# Patient Record
Sex: Female | Born: 1975 | Race: Black or African American | Hispanic: No | Marital: Married | Smoking: Never smoker
Health system: Southern US, Community
[De-identification: ages and names within clinical notes are randomized; demographics above are authoritative.]

## PROBLEM LIST (undated history)

## (undated) HISTORY — PX: WISDOM TOOTH EXTRACTION: SHX21

---

## 2009-07-02 ENCOUNTER — Ambulatory Visit: Payer: Self-pay | Admitting: Internal Medicine

## 2009-07-23 ENCOUNTER — Ambulatory Visit: Payer: Self-pay | Admitting: Internal Medicine

## 2011-06-24 ENCOUNTER — Ambulatory Visit: Payer: Self-pay | Admitting: Internal Medicine

## 2015-03-07 ENCOUNTER — Ambulatory Visit (INDEPENDENT_AMBULATORY_CARE_PROVIDER_SITE_OTHER): Payer: 59 | Admitting: Obstetrics and Gynecology

## 2015-03-07 ENCOUNTER — Encounter: Payer: Self-pay | Admitting: Obstetrics and Gynecology

## 2015-03-07 VITALS — BP 162/99 | HR 73 | Ht 68.0 in | Wt 230.0 lb

## 2015-03-07 DIAGNOSIS — E669 Obesity, unspecified: Secondary | ICD-10-CM

## 2015-03-07 DIAGNOSIS — Z1151 Encounter for screening for human papillomavirus (HPV): Secondary | ICD-10-CM

## 2015-03-07 DIAGNOSIS — Z01419 Encounter for gynecological examination (general) (routine) without abnormal findings: Secondary | ICD-10-CM

## 2015-03-07 DIAGNOSIS — Z124 Encounter for screening for malignant neoplasm of cervix: Secondary | ICD-10-CM

## 2015-03-07 NOTE — Progress Notes (Signed)
Pt BP = 162/99 taken in lt arm, repeated in rt arm BP = 142/100.  Pt is c/o headache this morning.

## 2015-03-07 NOTE — Progress Notes (Signed)
  Subjective:     Rhonda Sloan is a 39 y.o. female G1P0010 with LMP 02/22/2015 and BMI 35 who is here for a comprehensive physical exam. The patient reports experiencing right breast tenderness last month around the time of her cycle. She is otherwise without complaints. She is sexually active without contraception and would welcome a pregnancy if it were to occur. She has monthly menses lasting 5 days with moderate flow.  Social History   Social History  . Marital Status: Married    Spouse Name: N/A  . Number of Children: N/A  . Years of Education: N/A   Occupational History  . Not on file.   Social History Main Topics  . Smoking status: Never Smoker   . Smokeless tobacco: Not on file  . Alcohol Use: Yes     Comment: socially  . Drug Use: No  . Sexual Activity: Yes    Birth Control/ Protection: None   Other Topics Concern  . Not on file   Social History Narrative  . No narrative on file   Health Maintenance  Topic Date Due  . HIV Screening  10/26/1988  . PAP SMEAR  10/27/1991  . TETANUS/TDAP  10/26/1992  . INFLUENZA VACCINE  02/18/2015   History reviewed. No pertinent past medical history. Past Surgical History  Procedure Laterality Date  . Wisdom tooth extraction     Family History  Problem Relation Age of Onset  . Hypertension Mother   . Diabetes Mother   . Hyperlipidemia Mother   . Hypertension Father        Review of Systems Pertinent items are noted in HPI.   Objective:  Blood pressure 162/99, pulse 73, height  (1.727 m), weight 230 lb (104.327 kg), last menstrual period 02/22/2015.     GENERAL: Well-developed, well-nourished female in no acute distress.  HEENT: Normocephalic, atraumatic. Sclerae anicteric.  NECK: Supple. Normal thyroid.  LUNGS: Clear to auscultation bilaterally.  HEART: Regular rate and rhythm. BREASTS: Symmetric in size. No palpable masses or lymphadenopathy, skin changes, or nipple drainage. ABDOMEN: Soft, nontender,  nondistended. No organomegaly. PELVIC: Normal external female genitalia. Vagina is pink and rugated.  Normal discharge. Normal appearing cervix. Uterus is normal in size. No adnexal mass or tenderness. EXTREMITIES: No cyanosis, clubbing, or edema, 2+ distal pulses.    Assessment:    Healthy female exam.      Plan:    - pap smear collected today - Referral for screening mammogram provided - Patient with high blood pressure today. Patient reports normal blood pressure prior to today's visit. She is currently under the care of at a bariatric clinic and was started on Phentermine for weight loss a week ago. I advised the patient to contact the clinic ASAP and inform of elevated BP. She may have to discontinue the medication. - Patient will be contacted with any abnormal results  See After Visit Summary for Counseling Recommendations

## 2015-03-08 LAB — CYTOLOGY - PAP

## 2015-03-12 ENCOUNTER — Ambulatory Visit
Admission: RE | Admit: 2015-03-12 | Discharge: 2015-03-12 | Disposition: A | Discharge status: Home / Self Care | Payer: 59 | Source: Ambulatory Visit | Attending: Obstetrics and Gynecology | Admitting: Obstetrics and Gynecology

## 2015-03-12 DIAGNOSIS — Z1231 Encounter for screening mammogram for malignant neoplasm of breast: Secondary | ICD-10-CM | POA: Diagnosis not present

## 2015-03-12 DIAGNOSIS — Z01419 Encounter for gynecological examination (general) (routine) without abnormal findings: Secondary | ICD-10-CM | POA: Insufficient documentation

## 2015-03-15 ENCOUNTER — Other Ambulatory Visit: Payer: Self-pay | Admitting: Obstetrics and Gynecology

## 2015-03-15 DIAGNOSIS — N6489 Other specified disorders of breast: Secondary | ICD-10-CM

## 2015-03-15 DIAGNOSIS — R928 Other abnormal and inconclusive findings on diagnostic imaging of breast: Secondary | ICD-10-CM

## 2015-03-21 ENCOUNTER — Ambulatory Visit
Admission: RE | Admit: 2015-03-21 | Discharge: 2015-03-21 | Disposition: A | Discharge status: Home / Self Care | Payer: 59 | Source: Ambulatory Visit | Attending: Obstetrics and Gynecology | Admitting: Obstetrics and Gynecology

## 2015-03-21 DIAGNOSIS — N6489 Other specified disorders of breast: Secondary | ICD-10-CM | POA: Insufficient documentation

## 2015-03-21 DIAGNOSIS — R928 Other abnormal and inconclusive findings on diagnostic imaging of breast: Secondary | ICD-10-CM | POA: Diagnosis present

## 2015-04-23 ENCOUNTER — Encounter: Payer: Self-pay | Admitting: Obstetrics and Gynecology

## 2015-05-22 ENCOUNTER — Encounter: Payer: Self-pay | Admitting: *Deleted

## 2015-10-16 ENCOUNTER — Telehealth: Payer: Self-pay

## 2015-10-16 ENCOUNTER — Telehealth: Payer: Self-pay | Admitting: *Deleted

## 2015-10-16 DIAGNOSIS — R922 Inconclusive mammogram: Secondary | ICD-10-CM

## 2015-10-16 NOTE — Telephone Encounter (Signed)
Pt had diagnostic mammogram and US of the left breast due to inconclusive mammogram related to dense breast tissue.  Recommended f/u in 6 months.  [facility] called in regards to follow-up.  Order placed.

## 2015-10-16 NOTE — Telephone Encounter (Signed)
Called to inform patient that we placed an order for her to have a diagnostic mammogram and a breast ultrasound and she should be able to call and schedule this appt according to what days and times best suit her. Instructed her to call us back at the office if she had an issues or needed additional help. Patient understands

## 2015-11-01 ENCOUNTER — Ambulatory Visit
Admission: RE | Admit: 2015-11-01 | Discharge: 2015-11-01 | Disposition: A | Discharge status: Home / Self Care | Payer: 59 | Source: Ambulatory Visit | Attending: Obstetrics and Gynecology | Admitting: Obstetrics and Gynecology

## 2015-11-01 ENCOUNTER — Other Ambulatory Visit: Payer: Self-pay | Admitting: Obstetrics and Gynecology

## 2015-11-01 DIAGNOSIS — R922 Inconclusive mammogram: Secondary | ICD-10-CM

## 2015-11-01 DIAGNOSIS — N6489 Other specified disorders of breast: Secondary | ICD-10-CM | POA: Diagnosis present

## 2016-04-03 IMAGING — MG MM DIAG BREAST TOMO UNI LEFT
6 of 9 series · 6 of 21 positions shown · non-contrast
Comparison: 07/02/2009

CLINICAL DATA: 39-year-old female, callback from screening
mammogram for possible left breast asymmetry

EXAM:
DIGITAL DIAGNOSTIC LEFT MAMMOGRAM WITH 3D TOMOSYNTHESIS WITH CAD
ULTRASOUND LEFT BREAST

[L MLO (1 of 2)]
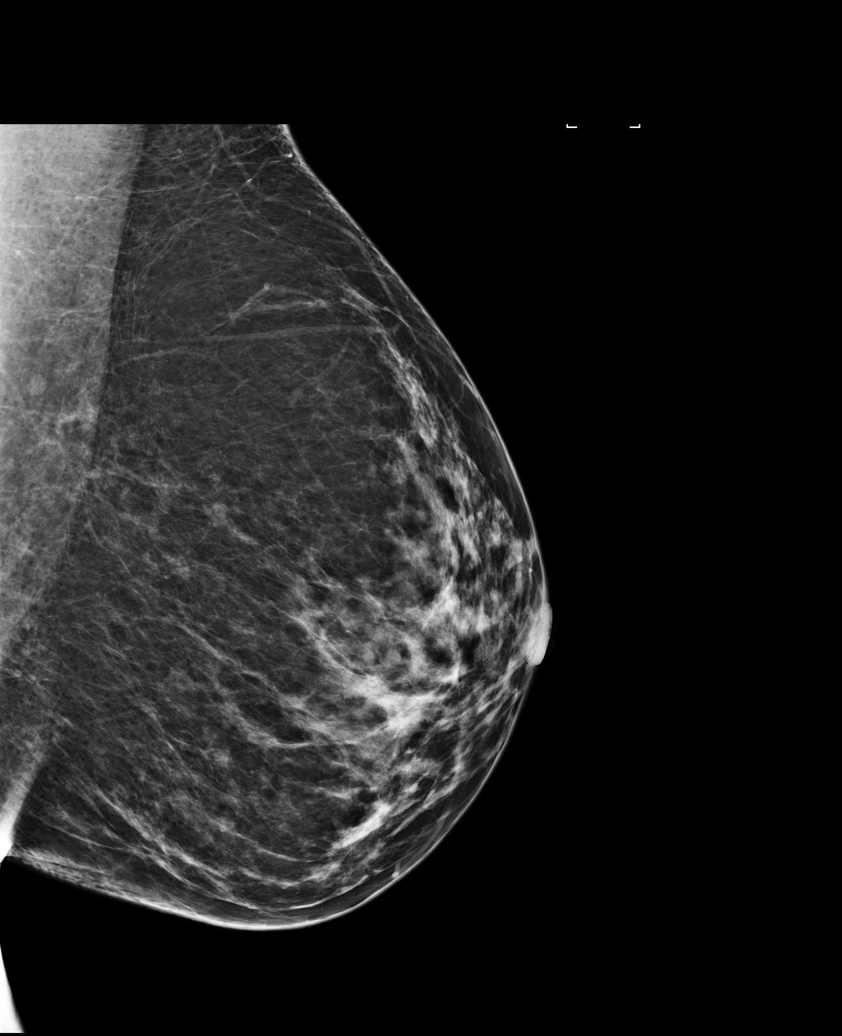

[L CC synth-2D]
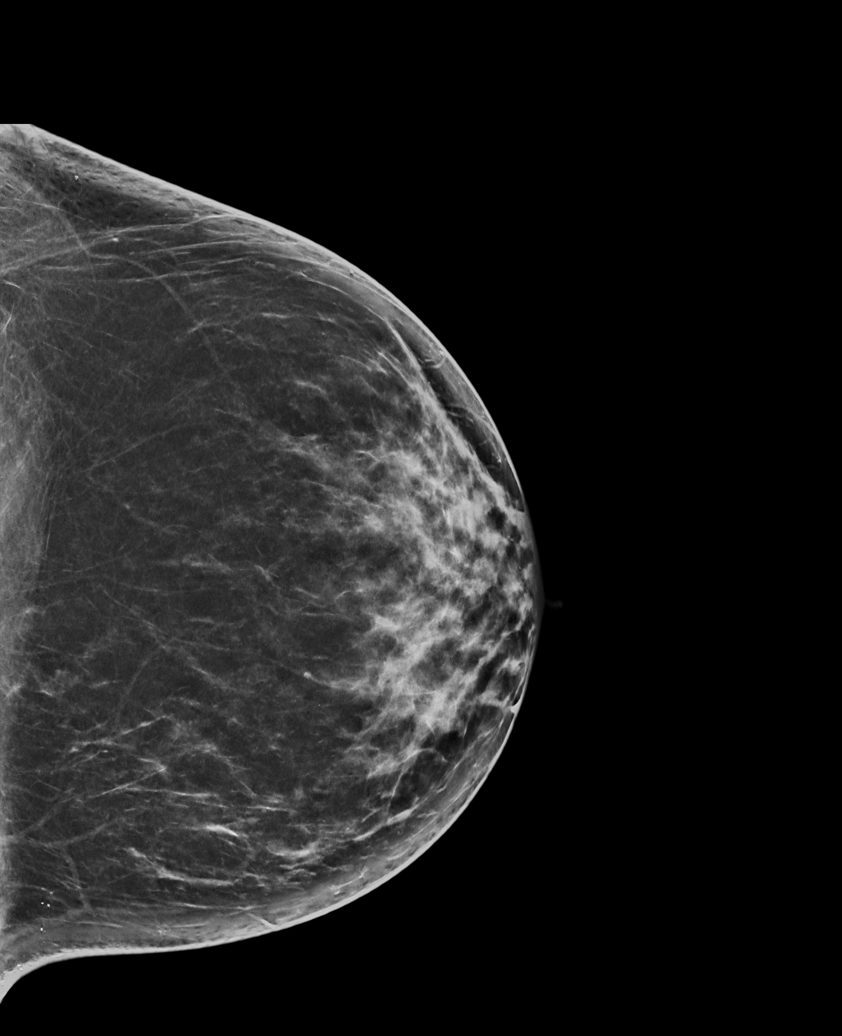

[L MLO synth-2D (1 of 2)]
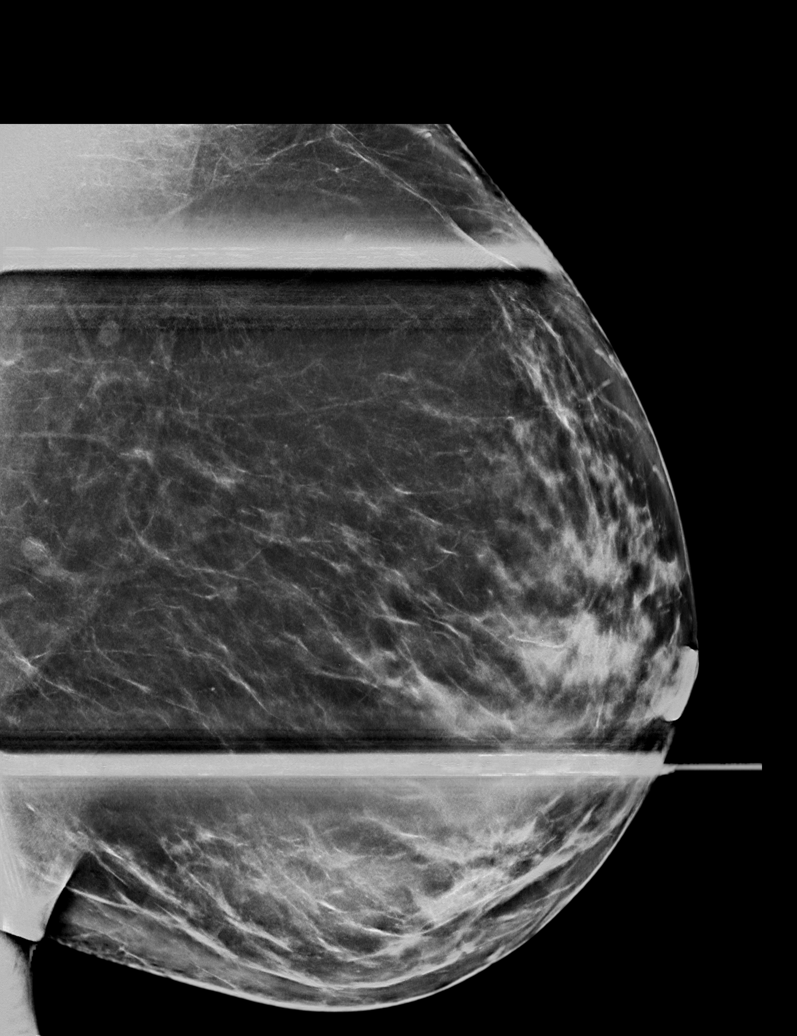

[L MLO (2 of 2)]
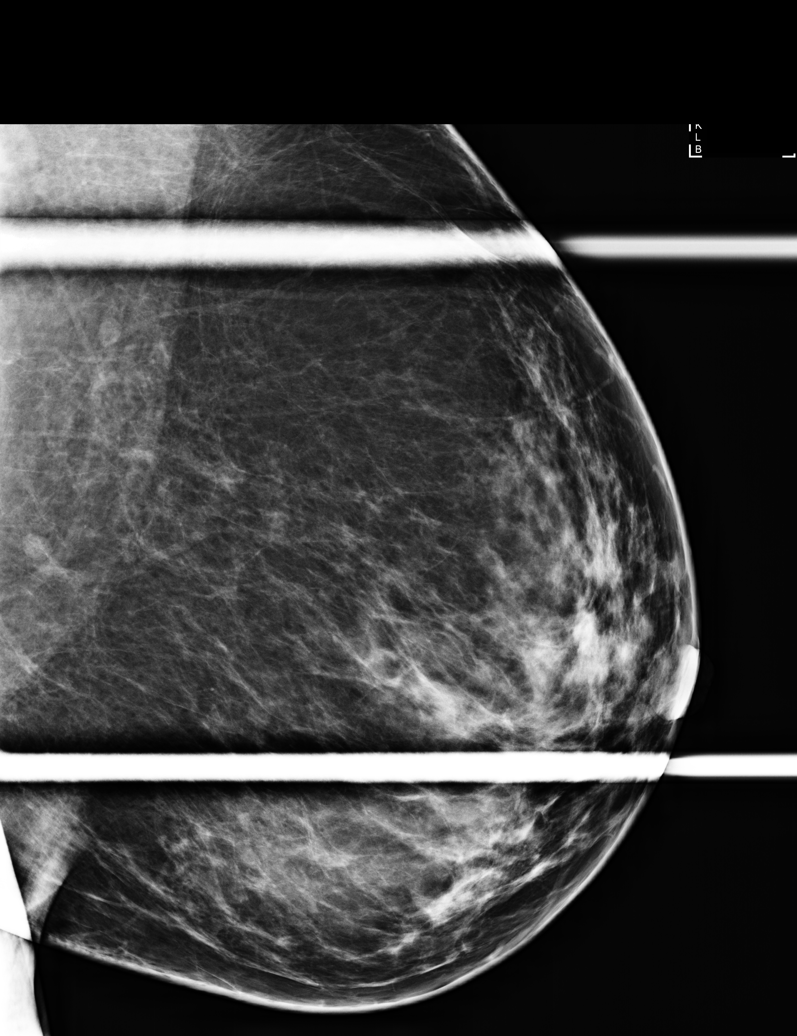

[L CC]
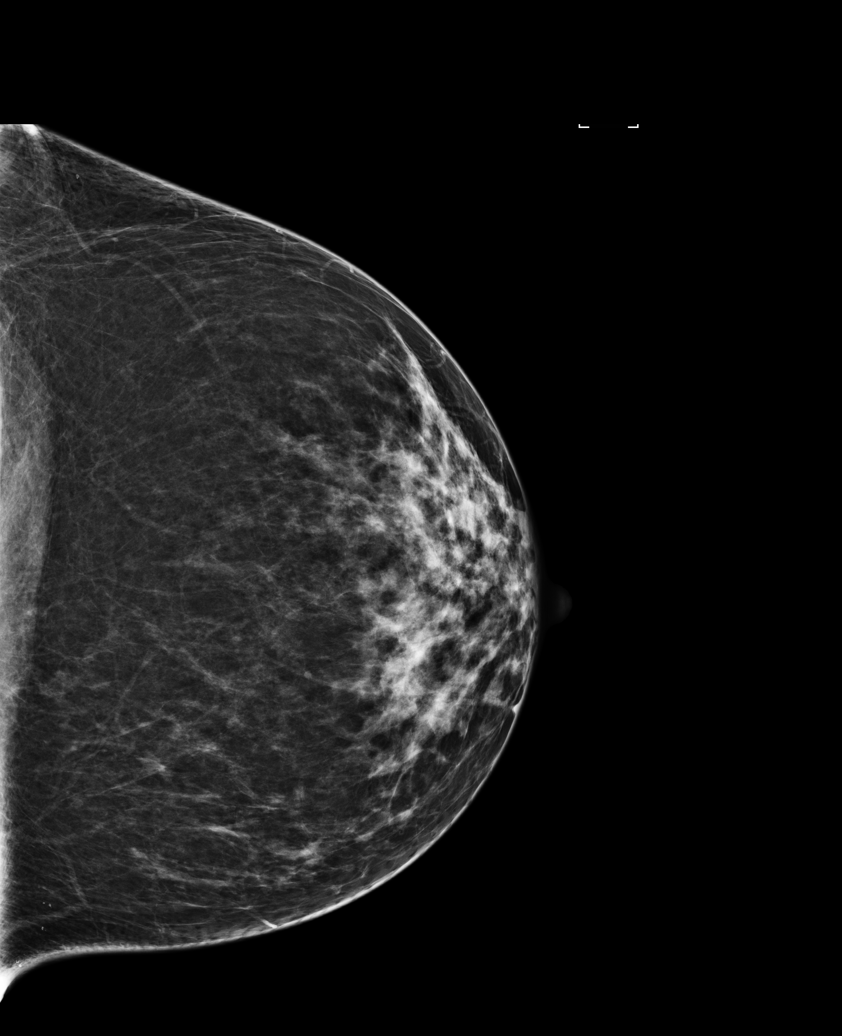

[L MLO synth-2D (2 of 2)]
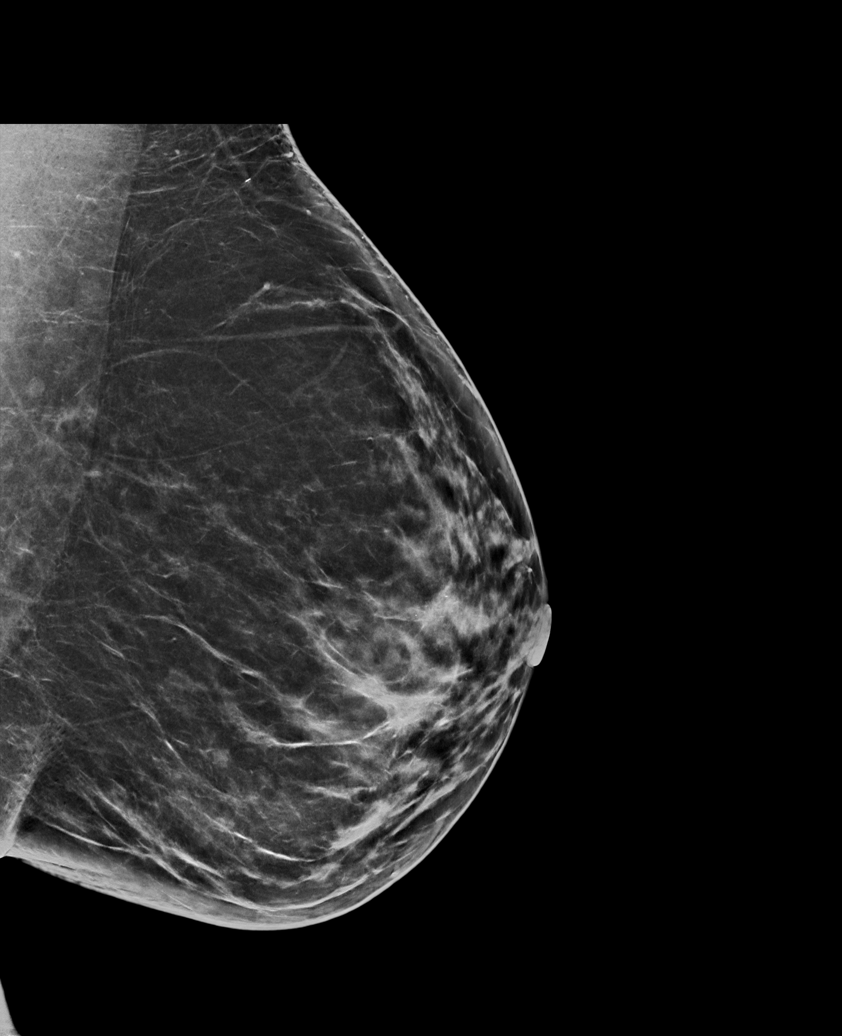

[6 of 21 positions shown; findings below may reference images not displayed]

ACR Breast Density Category c: The breast tissue is heterogeneously
dense, which may obscure small masses.
FINDINGS: CC, MLO, and spot-compression MLO views of the left breast with
tomosynthesis were performed. On the additional views, the possible
asymmetry identified on screening mammogram persists but is less
prominent. This finding is thought to likely represent an island of
normal tissue. This finding may have been present previously;
however, the patient's prior mammogram from 4777 did not include as
much posterior tissue.

Mammographic images were processed with CAD.

On physical exam, no discrete mass is felt in the area of concern
within the superior left breast.

Targeted ultrasound of the area of concern within the superior left
breast was performed demonstrating no suspicious cystic or solid
sonographic finding.
IMPRESSION: Probably benign left breast finding.

RECOMMENDATION:
Left diagnostic mammogram and possible ultrasound in 6 months.

I have discussed the findings and recommendations with the patient.
Results were also provided in writing at the conclusion of the
visit. If applicable, a reminder letter will be sent to the patient
regarding the next appointment.

BI-RADS CATEGORY  3: Probably benign.
